# Patient Record
Sex: Female | Born: 1985 | Race: White | Hispanic: Yes | Marital: Single | Smoking: Never smoker
Health system: Southern US, Community
[De-identification: ages and names within clinical notes are randomized; demographics above are authoritative.]

## PROBLEM LIST (undated history)

## (undated) HISTORY — PX: BREAST RECONSTRUCTION WITH MASTOPLASTY: SHX6752

---

## 2020-11-19 ENCOUNTER — Emergency Department (HOSPITAL_COMMUNITY): Payer: 59

## 2020-11-19 ENCOUNTER — Other Ambulatory Visit: Payer: Self-pay

## 2020-11-19 ENCOUNTER — Encounter (HOSPITAL_COMMUNITY): Payer: Self-pay

## 2020-11-19 ENCOUNTER — Emergency Department (HOSPITAL_COMMUNITY)
Admission: EM | Admit: 2020-11-19 | Discharge: 2020-11-19 | Disposition: A | Discharge status: Home / Self Care | Payer: 59 | Attending: Emergency Medicine | Admitting: Emergency Medicine

## 2020-11-19 DIAGNOSIS — R102 Pelvic and perineal pain: Secondary | ICD-10-CM

## 2020-11-19 DIAGNOSIS — R1031 Right lower quadrant pain: Secondary | ICD-10-CM | POA: Insufficient documentation

## 2020-11-19 DIAGNOSIS — N83201 Unspecified ovarian cyst, right side: Secondary | ICD-10-CM

## 2020-11-19 LAB — URINALYSIS, ROUTINE W REFLEX MICROSCOPIC
Bacteria, UA: NONE SEEN
Bilirubin Urine: NEGATIVE
Glucose, UA: NEGATIVE mg/dL
Hgb urine dipstick: NEGATIVE
Ketones, ur: 5 mg/dL — AB
Nitrite: NEGATIVE
Protein, ur: NEGATIVE mg/dL
Specific Gravity, Urine: 1.032 — ABNORMAL HIGH (ref 1.005–1.030)
pH: 5 (ref 5.0–8.0)

## 2020-11-19 LAB — COMPREHENSIVE METABOLIC PANEL
ALT: 34 U/L (ref 0–44)
AST: 29 U/L (ref 15–41)
Albumin: 4.4 g/dL (ref 3.5–5.0)
Alkaline Phosphatase: 55 U/L (ref 38–126)
Anion gap: 5 (ref 5–15)
BUN: 13 mg/dL (ref 6–20)
CO2: 23 mmol/L (ref 22–32)
Calcium: 8.9 mg/dL (ref 8.9–10.3)
Chloride: 109 mmol/L (ref 98–111)
Creatinine, Ser: 0.59 mg/dL (ref 0.44–1.00)
GFR, Estimated: >60 mL/min (ref >60)
Glucose, Bld: 114 mg/dL — ABNORMAL HIGH (ref 70–99)
Potassium: 3.3 mmol/L — ABNORMAL LOW (ref 3.5–5.1)
Sodium: 137 mmol/L (ref 135–145)
Total Bilirubin: 0.4 mg/dL (ref 0.3–1.2)
Total Protein: 6.7 g/dL (ref 6.5–8.1)

## 2020-11-19 LAB — CBC WITH DIFFERENTIAL/PLATELET
Abs Immature Granulocytes: 0.04 10*3/uL (ref 0.00–0.07)
Basophils Absolute: 0 10*3/uL (ref 0.0–0.1)
Basophils Relative: 0 %
Eosinophils Absolute: 0.1 10*3/uL (ref 0.0–0.5)
Eosinophils Relative: 1 %
HCT: 36.9 % (ref 36.0–46.0)
Hemoglobin: 12.2 g/dL (ref 12.0–15.0)
Immature Granulocytes: 0 %
Lymphocytes Relative: 7 %
Lymphs Abs: 1 10*3/uL (ref 0.7–4.0)
MCH: 31.4 pg (ref 26.0–34.0)
MCHC: 33.1 g/dL (ref 30.0–36.0)
MCV: 94.9 fL (ref 80.0–100.0)
Monocytes Absolute: 0.6 10*3/uL (ref 0.1–1.0)
Monocytes Relative: 4 %
Neutro Abs: 11.5 10*3/uL — ABNORMAL HIGH (ref 1.7–7.7)
Neutrophils Relative %: 88 %
Platelets: 255 10*3/uL (ref 150–400)
RBC: 3.89 MIL/uL (ref 3.87–5.11)
RDW: 12.8 % (ref 11.5–15.5)
WBC: 13.2 10*3/uL — ABNORMAL HIGH (ref 4.0–10.5)
nRBC: 0 % (ref 0.0–0.2)

## 2020-11-19 LAB — LIPASE, BLOOD: Lipase: 34 U/L (ref 11–51)

## 2020-11-19 LAB — I-STAT BETA HCG BLOOD, ED (MC, WL, AP ONLY): I-stat hCG, quantitative: <5 m[IU]/mL (ref <5)

## 2020-11-19 MED ORDER — OXYCODONE-ACETAMINOPHEN 5-325 MG PO TABS: 1.0000 | ORAL_TABLET | Freq: Four times a day (QID) | ORAL | 0 refills | Status: AC | PRN

## 2020-11-19 MED ORDER — KETOROLAC TROMETHAMINE 15 MG/ML IJ SOLN
15.0000 mg | Freq: Once | INTRAMUSCULAR | Status: AC
  Administered 2020-11-19: 15 mg via INTRAVENOUS
  Filled 2020-11-19: qty 1

## 2020-11-19 MED ORDER — ONDANSETRON 8 MG PO TBDP: 8.0000 mg | ORAL_TABLET | Freq: Three times a day (TID) | ORAL | 0 refills | Status: AC | PRN

## 2020-11-19 MED ORDER — IOHEXOL 300 MG/ML  SOLN
100.0000 mL | Freq: Once | INTRAMUSCULAR | Status: AC | PRN
  Administered 2020-11-19: 100 mL via INTRAVENOUS

## 2020-11-19 NOTE — ED Triage Notes (Addendum)
Pt arrived via POV, c/o sudden onset RLQ pain since 3am this morning, radiating to right back/flank. Diarrhea this morning as well. Took tylenol with minimal relief.

## 2020-11-19 NOTE — ED Notes (Signed)
Per Dr. Stevie Kern, pt's [place] to be expedited d/t concern for ovarian torsion. This nurse paged [place] dept and they are aware.

## 2020-11-19 NOTE — ED Notes (Signed)
CT paged and aware.

## 2020-11-19 NOTE — ED Provider Notes (Signed)
Patient signed to me by Dr. Stevie Kern pending results of abdominal CT which showed hemorrhagic ovarian cyst.  Patient to be discharged   Lorre Nick, MD 11/19/20 1644

## 2020-11-19 NOTE — ED Provider Notes (Signed)
[facility]-EMERGENCY DEPT Provider Note   CSN: [id number] Arrival date & time: 11/19/20  1032     History Chief Complaint  Patient presents with  . Abdominal Pain    Francie Keeling is a 35 y.o. female.  Presents ER with concern for right lower quadrant abdominal pain.  Patient states that she has been having pain since early this morning.  Radiates to right flank.  Pain is relatively constant, moderate to severe in nature.  Sharp and stabbing.  Took Tylenol, no relief.  No alleviating factors.  And she denies any lower pelvic pain, no vaginal bleeding or vaginal discharge.  Uses IUD.  No fevers, some nausea but no vomiting.    HPI     History reviewed. No pertinent past medical history.  There are no problems to display for this patient.   Past Surgical History:  Procedure Laterality Date  . BREAST RECONSTRUCTION WITH MASTOPLASTY       OB History   No obstetric history on file.     History reviewed. No pertinent family history.  Social History   Tobacco Use  . Smoking status: Never Smoker  . Smokeless tobacco: Never Used    Home Medications Prior to Admission medications   Medication Sig Start Date End Date Taking? Authorizing Provider  buPROPion (WELLBUTRIN XL) 300 MG 24 hr tablet Take 300 mg by mouth daily.   Yes [provider]  clotrimazole-betamethasone (LOTRISONE) cream Apply 1 application topically 2 (two) times daily. 11/18/20  Yes [provider]  metroNIDAZOLE (FLAGYL) 500 MG tablet Take 500 mg by mouth 2 (two) times daily. 10/27/20  Yes [provider]  montelukast (SINGULAIR) 10 MG tablet Take 1 tablet by mouth daily. 10/20/20  Yes [provider]    Allergies    Patient has no known allergies.  Review of Systems   Review of Systems  Constitutional: Negative for chills and fever.  HENT: Negative for ear pain and sore throat.   Eyes: Negative for pain and visual disturbance.  Respiratory:  Negative for cough and shortness of breath.   Cardiovascular: Negative for chest pain and palpitations.  Gastrointestinal: Positive for abdominal pain. Negative for vomiting.  Genitourinary: Negative for dysuria and hematuria.  Musculoskeletal: Negative for arthralgias and back pain.  Skin: Negative for color change and rash.  Neurological: Negative for seizures and syncope.  All other systems reviewed and are negative.   Physical Exam Updated Vital Signs BP 96/63   Pulse 90   Temp 98 F (36.7 C) (Oral)   Resp 18   SpO2 98%   Physical Exam Vitals and nursing note reviewed.  Constitutional:      General: She is not in acute distress.    Appearance: She is well-developed.  HENT:     Head: Normocephalic and atraumatic.  Eyes:     Conjunctiva/sclera: Conjunctivae normal.  Cardiovascular:     Rate and Rhythm: Normal rate and regular rhythm.     Heart sounds: No murmur heard.   Pulmonary:     Effort: Pulmonary effort is normal. No respiratory distress.     Breath sounds: Normal breath sounds.  Abdominal:     Palpations: Abdomen is soft.     Tenderness: There is abdominal tenderness in the right lower quadrant.     Comments: Tenderness to palpation in right lower quadrant, there is no rebound or guarding  Musculoskeletal:     Cervical back: Neck supple.  Skin:    General: Skin is warm  and dry.  Neurological:     Mental Status: She is alert.  Psychiatric:        Mood and Affect: Mood normal.     ED Results / Procedures / Treatments   Labs (all labs ordered are listed, but only abnormal results are displayed) Labs Reviewed  CBC WITH DIFFERENTIAL/PLATELET - Abnormal; Notable for the following components:      Result Value   WBC 13.2 (*)    Neutro Abs 11.5 (*)    All other components within normal limits  COMPREHENSIVE METABOLIC PANEL - Abnormal; Notable for the following components:   Potassium 3.3 (*)    Glucose, Bld 114 (*)    All other components within normal  limits  URINALYSIS, ROUTINE W REFLEX MICROSCOPIC - Abnormal; Notable for the following components:   Specific Gravity, Urine 1.032 (*)    Ketones, ur 5 (*)    Leukocytes,Ua SMALL (*)    All other components within normal limits  LIPASE, BLOOD  I-STAT BETA HCG BLOOD, ED ([facility], [facility], [facility] ONLY)    EKG None  Radiology No results found.  Procedures Procedures   Medications Ordered in ED Medications  ketorolac (TORADOL) 15 MG/ML injection 15 mg (15 mg Intravenous Given 11/19/20 1148)  iohexol (OMNIPAQUE) 300 MG/ML solution 100 mL (100 mLs Intravenous Contrast Given 11/19/20 1339)    ED Course  I have reviewed the triage vital signs and the nursing notes.  Pertinent labs & imaging results that were available during my care of the patient were reviewed by me and considered in my medical decision making (see chart for details).    MDM Rules/Calculators/A&P                          35 year old lady presents to ER with concern for right lower quadrant abdominal pain.  On exam patient well-appearing in no distress.  Noted some tenderness in the right lower quadrant on exam.  Basic labs are stable except for mild leukocytosis.  CT scan negative for appendicitis or renal pathology but was concerning for hemorrhagic cyst versus endometrioma versus possible ovarian torsion.  I have ordered transvaginal ultrasound and ultrasound apartment has been contacted to expedite this study to rule out torsion.  Patient currently well-appearing.  Signed out to Dr. Freida Busman to follow-up on ultrasound.   Final Clinical Impression(s) / ED Diagnoses Final diagnoses:  RLQ abdominal pain    Rx / DC Orders ED Discharge Orders    None       Milagros Loll, MD 11/19/20 1535

## 2023-05-03 IMAGING — CT CT ABD-PELV W/ CM
2 of 4 series · 15 of 46 positions shown, 17 images · IV contrast (OMNIPAQUE 300)
Comparison: None.

CLINICAL DATA: Abdominal pain, primarily right cited

EXAM:
CT ABDOMEN AND PELVIS WITH CONTRAST
TECHNIQUE: Multidetector CT imaging of the abdomen and pelvis was performed
using the standard protocol following bolus administration of
intravenous contrast.
CONTRAST:  100mL OMNIPAQUE IOHEXOL 300 MG/ML  SOLN

[Series 2: axial st · axial · 0.78mm/px · z∈[-629,-219]mm · 12 of 94 slices shown, 14 images]
[im 6/94  soft-tissue]
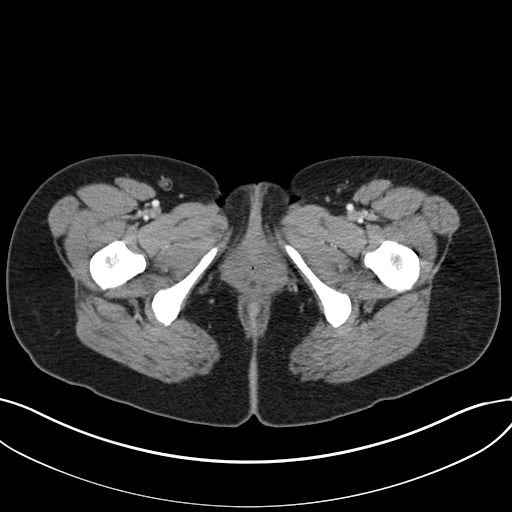
[im 6/94  bone]
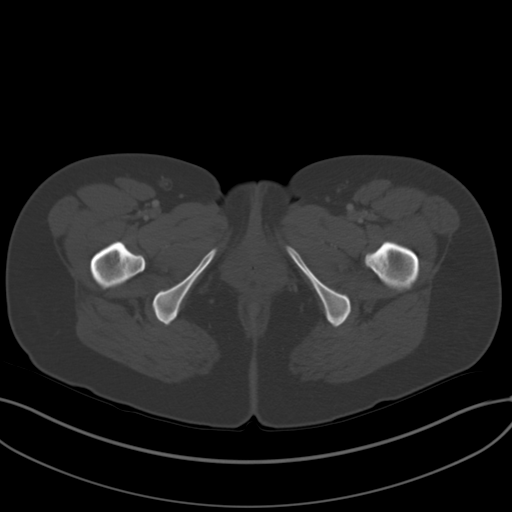
[im 17/94  soft-tissue]
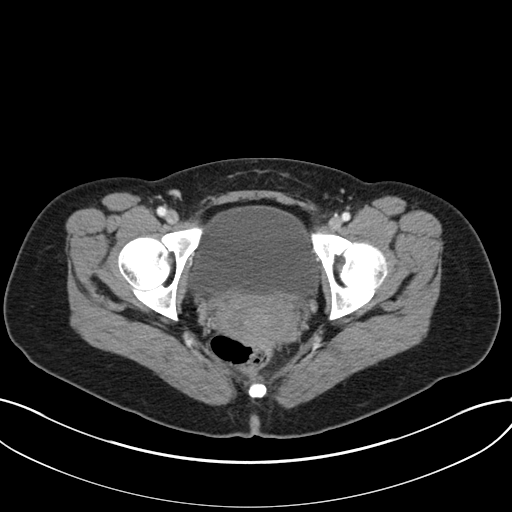
[im 22/94  soft-tissue]
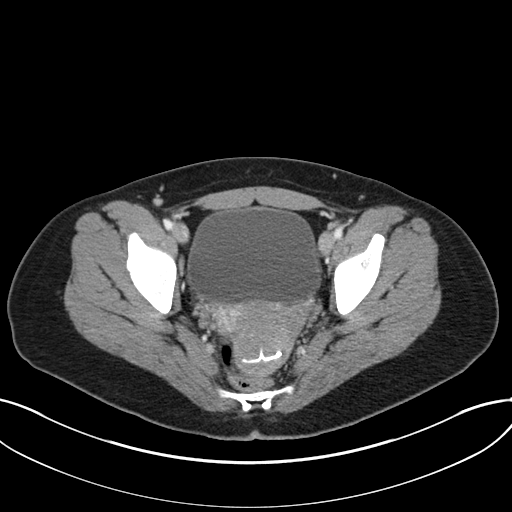
[im 28/94  soft-tissue]
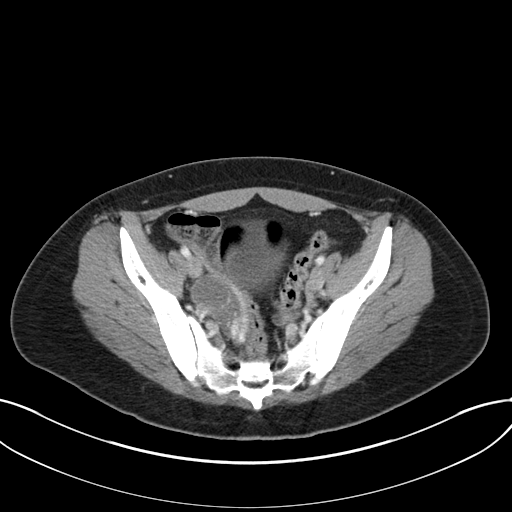
[im 39/94  soft-tissue]
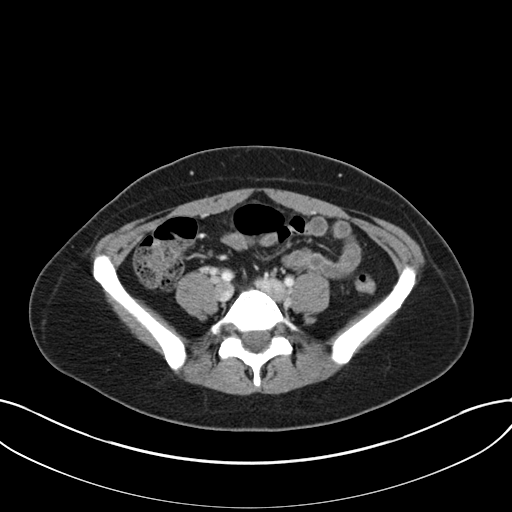
[im 44/94  soft-tissue]
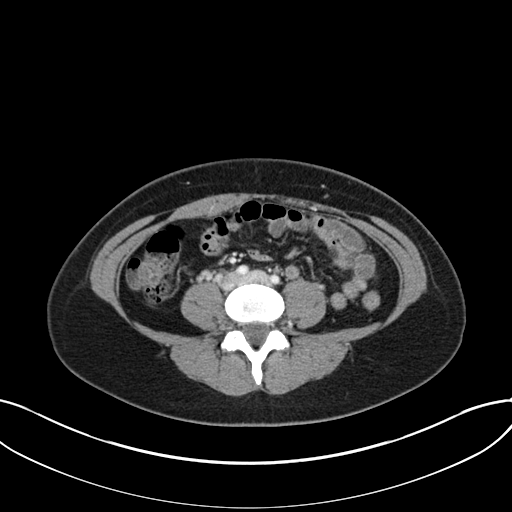
[im 50/94  soft-tissue]
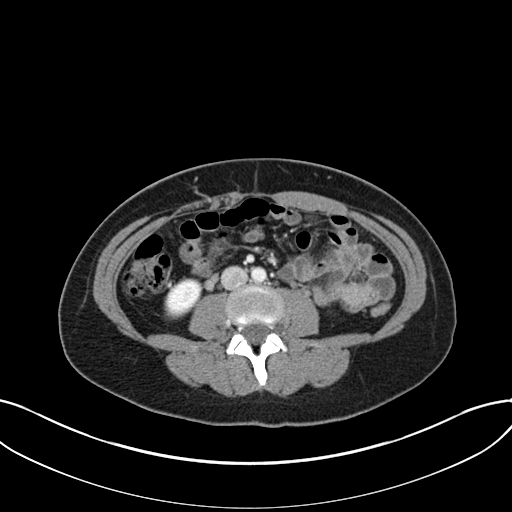
[im 61/94  soft-tissue]
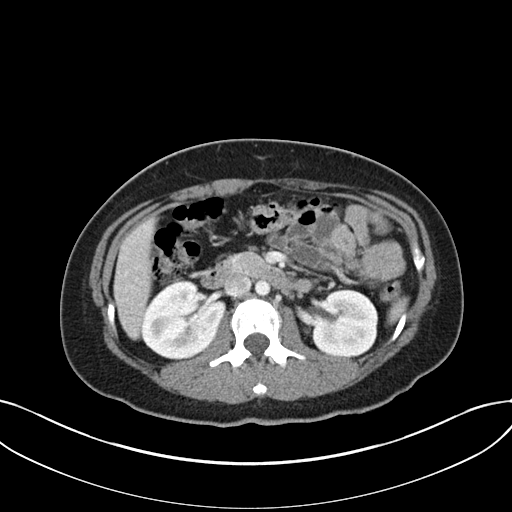
[im 66/94  soft-tissue]
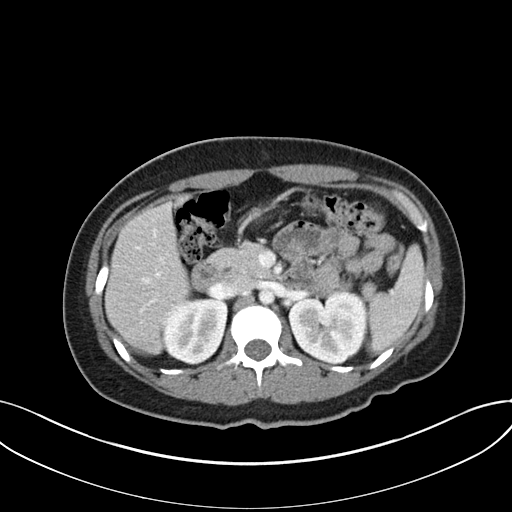
[im 66/94  bone]
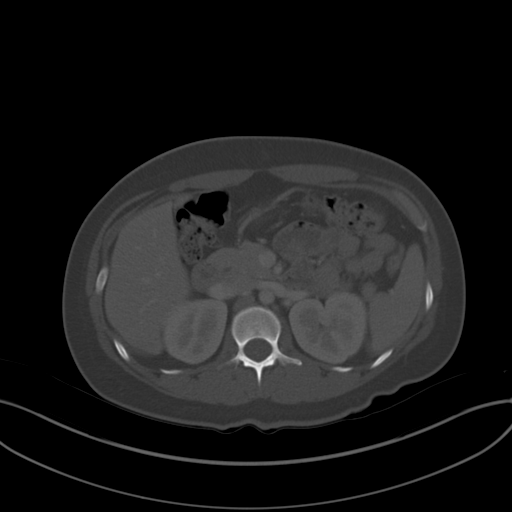
[im 72/94  soft-tissue]
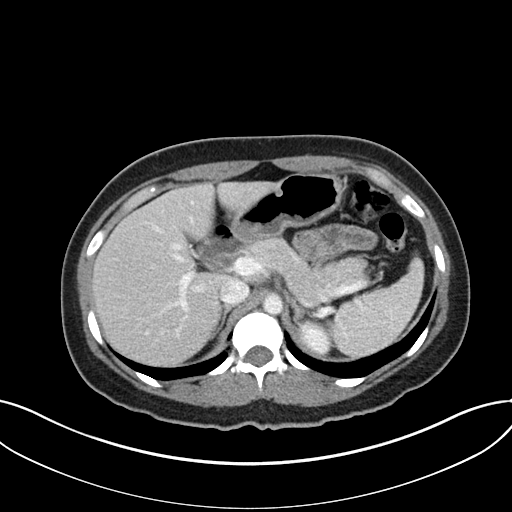
[im 83/94  soft-tissue]
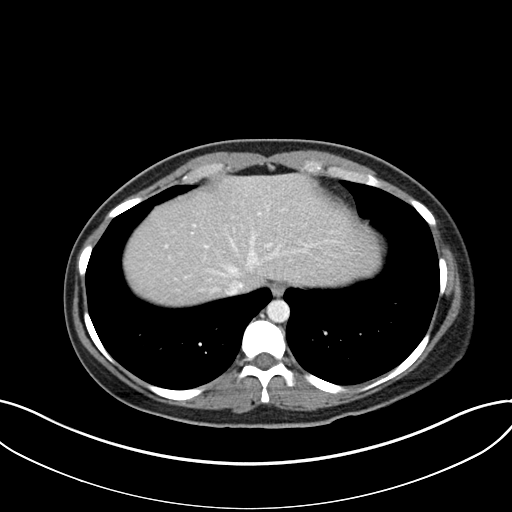
[im 88/94  soft-tissue]
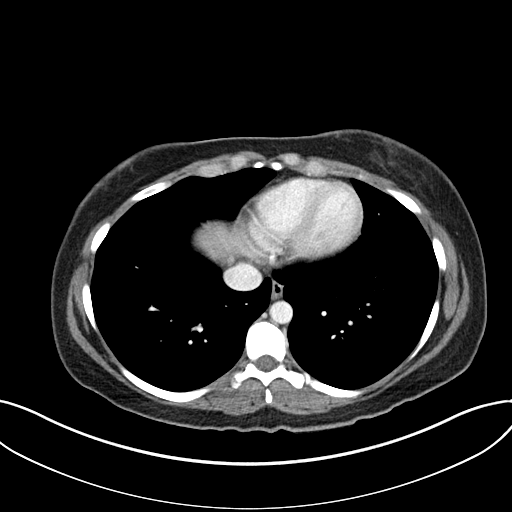

[Series 4: coronal st · coronal · 0.67mm/px · 3 of 124 slices shown]
[im 42/124  soft-tissue]
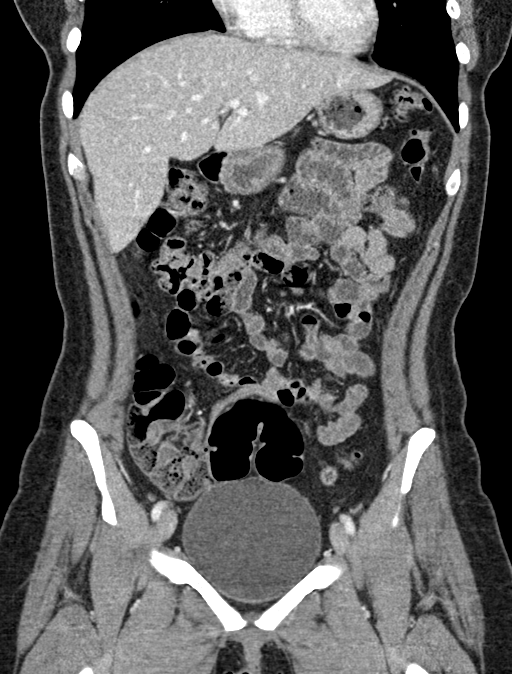
[im 55/124  soft-tissue]
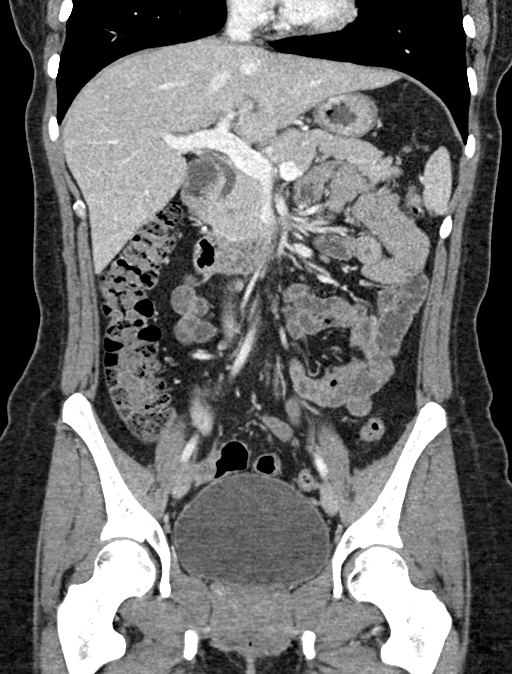
[im 69/124  soft-tissue]
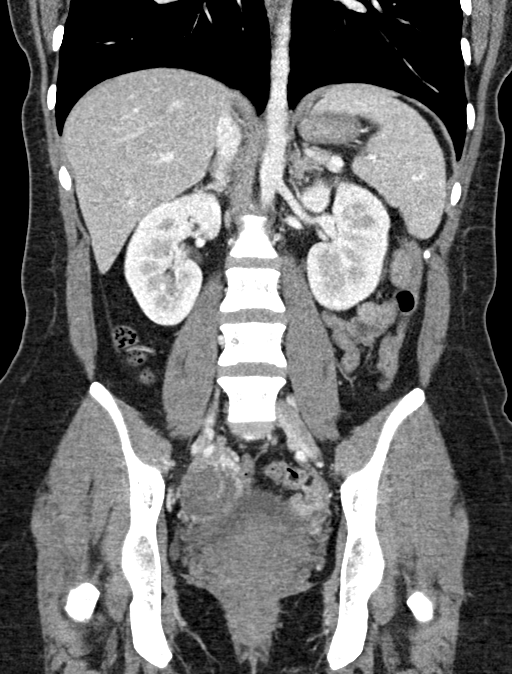

[15 of 46 positions shown; findings below may reference images not displayed]

FINDINGS: Lower chest: Lung bases are clear. Breast implants present
bilaterally.

Hepatobiliary: No focal liver lesions are appreciable. Gallbladder
not appreciable. Question absence. No biliary duct dilatation.

Pancreas: No pancreatic mass or inflammatory focus.

Spleen: No splenic lesions are evident.

Adrenals/Urinary Tract: Adrenals bilaterally appear normal. No
appreciable renal mass or hydronephrosis on either side. No renal or
ureteral calculus on either side. Urinary bladder midline with wall
thickness within normal limits.

Stomach/Bowel: No appreciable bowel wall or mesenteric thickening.
No evident bowel obstruction. Terminal ileum appears normal.
Appendix appears normal.

Vascular/Lymphatic: No abdominal aortic aneurysm. No appreciable
arterial vascular lesion. Major venous structures appear patent. No
adenopathy in the abdomen or pelvis.

Reproductive: Uterus is retroverted. Intrauterine device present
within the endometrium.

There is a septated right adnexal mass measuring 4.3 x 2.9 cm with a
small amount of immediately adjacent fluid. No left-sided adnexal
mass.

Other: No abscess in the abdomen or pelvis.  No ascites.

Musculoskeletal: No blastic or lytic bone lesions. No abdominal wall
or intramuscular lesions.
IMPRESSION: 1. Septated partially cystic right adnexal mass with a small amount
of adjacent fluid. Question hemorrhagic cyst in this area.
Endometrioma could present similarly. This appearance raises concern
for potential right ovarian torsion. Correlation with pelvic
ultrasound including Doppler assessment advised given this
appearance.

2.  Retroverted uterus.  Intrauterine device within the endometrium.

3. Appendix appears normal. No bowel obstruction. No abscess in the
abdomen or pelvis.

4. No evident renal or ureteral calculus. No hydronephrosis. Urinary
bladder wall thickness normal.
